# Patient Record
Sex: Male | Born: 1960 | Race: White | Hispanic: No | Marital: Married | Smoking: Never smoker
Health system: Southern US, Community
[De-identification: ages and names within clinical notes are randomized; demographics above are authoritative.]

---

## 2016-05-17 ENCOUNTER — Emergency Department (HOSPITAL_COMMUNITY): Payer: PRIVATE HEALTH INSURANCE

## 2016-05-17 ENCOUNTER — Emergency Department (HOSPITAL_COMMUNITY): Payer: PRIVATE HEALTH INSURANCE | Admitting: Certified Registered Nurse Anesthetist

## 2016-05-17 ENCOUNTER — Inpatient Hospital Stay (HOSPITAL_COMMUNITY): Payer: PRIVATE HEALTH INSURANCE

## 2016-05-17 ENCOUNTER — Inpatient Hospital Stay (HOSPITAL_COMMUNITY)
Admission: EM | Admit: 2016-05-17 | Discharge: 2016-05-29 | DRG: 957 | Disposition: A | Discharge status: Home / Self Care | Payer: PRIVATE HEALTH INSURANCE | Attending: General Surgery | Admitting: General Surgery

## 2016-05-17 ENCOUNTER — Encounter (HOSPITAL_COMMUNITY): Payer: Self-pay

## 2016-05-17 ENCOUNTER — Encounter (HOSPITAL_COMMUNITY): Admission: EM | Disposition: A | Discharge status: Home / Self Care | Payer: Self-pay | Source: Home / Self Care

## 2016-05-17 DIAGNOSIS — S27809A Unspecified injury of diaphragm, initial encounter: Secondary | ICD-10-CM | POA: Diagnosis present

## 2016-05-17 DIAGNOSIS — S1081XA Abrasion of other specified part of neck, initial encounter: Secondary | ICD-10-CM | POA: Diagnosis present

## 2016-05-17 DIAGNOSIS — R159 Full incontinence of feces: Secondary | ICD-10-CM | POA: Diagnosis present

## 2016-05-17 DIAGNOSIS — M48061 Spinal stenosis, lumbar region without neurogenic claudication: Secondary | ICD-10-CM | POA: Diagnosis present

## 2016-05-17 DIAGNOSIS — S92514A Nondisplaced fracture of proximal phalanx of right lesser toe(s), initial encounter for closed fracture: Secondary | ICD-10-CM | POA: Diagnosis present

## 2016-05-17 DIAGNOSIS — R0689 Other abnormalities of breathing: Secondary | ICD-10-CM

## 2016-05-17 DIAGNOSIS — S27809S Unspecified injury of diaphragm, sequela: Secondary | ICD-10-CM

## 2016-05-17 DIAGNOSIS — R579 Shock, unspecified: Secondary | ICD-10-CM | POA: Diagnosis present

## 2016-05-17 DIAGNOSIS — IMO0001 Reserved for inherently not codable concepts without codable children: Secondary | ICD-10-CM

## 2016-05-17 DIAGNOSIS — S322XXA Fracture of coccyx, initial encounter for closed fracture: Secondary | ICD-10-CM | POA: Diagnosis present

## 2016-05-17 DIAGNOSIS — R402362 Coma scale, best motor response, obeys commands, at arrival to emergency department: Secondary | ICD-10-CM | POA: Diagnosis present

## 2016-05-17 DIAGNOSIS — S272XXA Traumatic hemopneumothorax, initial encounter: Secondary | ICD-10-CM

## 2016-05-17 DIAGNOSIS — S50812A Abrasion of left forearm, initial encounter: Secondary | ICD-10-CM | POA: Diagnosis present

## 2016-05-17 DIAGNOSIS — R402242 Coma scale, best verbal response, confused conversation, at arrival to emergency department: Secondary | ICD-10-CM | POA: Diagnosis present

## 2016-05-17 DIAGNOSIS — S27808A Other injury of diaphragm, initial encounter: Principal | ICD-10-CM | POA: Diagnosis present

## 2016-05-17 DIAGNOSIS — R778 Other specified abnormalities of plasma proteins: Secondary | ICD-10-CM | POA: Diagnosis present

## 2016-05-17 DIAGNOSIS — M79674 Pain in right toe(s): Secondary | ICD-10-CM

## 2016-05-17 DIAGNOSIS — D62 Acute posthemorrhagic anemia: Secondary | ICD-10-CM | POA: Diagnosis not present

## 2016-05-17 DIAGNOSIS — S27321A Contusion of lung, unilateral, initial encounter: Secondary | ICD-10-CM | POA: Diagnosis present

## 2016-05-17 DIAGNOSIS — S32009A Unspecified fracture of unspecified lumbar vertebra, initial encounter for closed fracture: Secondary | ICD-10-CM | POA: Diagnosis present

## 2016-05-17 DIAGNOSIS — J9811 Atelectasis: Secondary | ICD-10-CM | POA: Diagnosis present

## 2016-05-17 DIAGNOSIS — Z9889 Other specified postprocedural states: Secondary | ICD-10-CM

## 2016-05-17 DIAGNOSIS — K449 Diaphragmatic hernia without obstruction or gangrene: Secondary | ICD-10-CM | POA: Diagnosis present

## 2016-05-17 DIAGNOSIS — S2691XA Contusion of heart, unspecified with or without hemopericardium, initial encounter: Secondary | ICD-10-CM | POA: Diagnosis present

## 2016-05-17 DIAGNOSIS — S30810A Abrasion of lower back and pelvis, initial encounter: Secondary | ICD-10-CM | POA: Diagnosis present

## 2016-05-17 DIAGNOSIS — S92911A Unspecified fracture of right toe(s), initial encounter for closed fracture: Secondary | ICD-10-CM | POA: Diagnosis present

## 2016-05-17 DIAGNOSIS — J9690 Respiratory failure, unspecified, unspecified whether with hypoxia or hypercapnia: Secondary | ICD-10-CM | POA: Diagnosis present

## 2016-05-17 DIAGNOSIS — S299XXA Unspecified injury of thorax, initial encounter: Secondary | ICD-10-CM

## 2016-05-17 DIAGNOSIS — R7989 Other specified abnormal findings of blood chemistry: Secondary | ICD-10-CM | POA: Diagnosis present

## 2016-05-17 DIAGNOSIS — R402142 Coma scale, eyes open, spontaneous, at arrival to emergency department: Secondary | ICD-10-CM | POA: Diagnosis present

## 2016-05-17 DIAGNOSIS — Y92410 Unspecified street and highway as the place of occurrence of the external cause: Secondary | ICD-10-CM

## 2016-05-17 DIAGNOSIS — E875 Hyperkalemia: Secondary | ICD-10-CM | POA: Diagnosis not present

## 2016-05-17 DIAGNOSIS — S32041A Stable burst fracture of fourth lumbar vertebra, initial encounter for closed fracture: Secondary | ICD-10-CM | POA: Diagnosis present

## 2016-05-17 DIAGNOSIS — Z938 Other artificial opening status: Secondary | ICD-10-CM

## 2016-05-17 DIAGNOSIS — S92501A Displaced unspecified fracture of right lesser toe(s), initial encounter for closed fracture: Secondary | ICD-10-CM | POA: Diagnosis present

## 2016-05-17 DIAGNOSIS — S32001A Stable burst fracture of unspecified lumbar vertebra, initial encounter for closed fracture: Secondary | ICD-10-CM

## 2016-05-17 DIAGNOSIS — S0001XA Abrasion of scalp, initial encounter: Secondary | ICD-10-CM | POA: Diagnosis present

## 2016-05-17 DIAGNOSIS — R748 Abnormal levels of other serum enzymes: Secondary | ICD-10-CM | POA: Diagnosis not present

## 2016-05-17 DIAGNOSIS — J9600 Acute respiratory failure, unspecified whether with hypoxia or hypercapnia: Secondary | ICD-10-CM | POA: Diagnosis present

## 2016-05-17 DIAGNOSIS — S32049A Unspecified fracture of fourth lumbar vertebra, initial encounter for closed fracture: Secondary | ICD-10-CM | POA: Diagnosis present

## 2016-05-17 DIAGNOSIS — S0081XA Abrasion of other part of head, initial encounter: Secondary | ICD-10-CM | POA: Diagnosis present

## 2016-05-17 DIAGNOSIS — R609 Edema, unspecified: Secondary | ICD-10-CM

## 2016-05-17 DIAGNOSIS — S93114A Dislocation of interphalangeal joint of right lesser toe(s), initial encounter: Secondary | ICD-10-CM | POA: Diagnosis present

## 2016-05-17 HISTORY — PX: LAPAROTOMY: SHX154

## 2016-05-17 LAB — PREPARE FRESH FROZEN PLASMA
UNIT DIVISION: 0
Unit division: 0

## 2016-05-17 LAB — COMPREHENSIVE METABOLIC PANEL
ALBUMIN: 3 g/dL — AB (ref 3.5–5.0)
ALT: 46 U/L (ref 17–63)
AST: 63 U/L — AB (ref 15–41)
Alkaline Phosphatase: 87 U/L (ref 38–126)
Anion gap: 10 (ref 5–15)
BUN: 14 mg/dL (ref 6–20)
CO2: 21 mmol/L — ABNORMAL LOW (ref 22–32)
CREATININE: 1.39 mg/dL — AB (ref 0.61–1.24)
Calcium: 8.1 mg/dL — ABNORMAL LOW (ref 8.9–10.3)
Chloride: 110 mmol/L (ref 101–111)
GFR calc Af Amer: >60 mL/min (ref >60)
GFR, EST NON AFRICAN AMERICAN: 55 mL/min — AB (ref >60)
GLUCOSE: 177 mg/dL — AB (ref 65–99)
POTASSIUM: 2.9 mmol/L — AB (ref 3.5–5.1)
Sodium: 141 mmol/L (ref 135–145)
Total Bilirubin: 0.4 mg/dL (ref 0.3–1.2)
Total Protein: 5.5 g/dL — ABNORMAL LOW (ref 6.5–8.1)

## 2016-05-17 LAB — POCT I-STAT 7, (LYTES, BLD GAS, ICA,H+H)
Acid-base deficit: 6 mmol/L — ABNORMAL HIGH (ref 0.0–2.0)
Bicarbonate: 22.3 mmol/L (ref 20.0–28.0)
CALCIUM ION: 1.19 mmol/L (ref 1.15–1.40)
HEMATOCRIT: 26 % — AB (ref 39.0–52.0)
Hemoglobin: 8.8 g/dL — ABNORMAL LOW (ref 13.0–17.0)
O2 Saturation: 97 %
POTASSIUM: 3.9 mmol/L (ref 3.5–5.1)
SODIUM: 143 mmol/L (ref 135–145)
TCO2: 24 mmol/L (ref 0–100)
pCO2 arterial: 48.1 mmHg — ABNORMAL HIGH (ref 32.0–48.0)
pH, Arterial: 7.254 — ABNORMAL LOW (ref 7.350–7.450)
pO2, Arterial: 92 mmHg (ref 83.0–108.0)

## 2016-05-17 LAB — CBC
HEMATOCRIT: 36.9 % — AB (ref 39.0–52.0)
HEMATOCRIT: 39.9 % (ref 39.0–52.0)
Hemoglobin: 12.5 g/dL — ABNORMAL LOW (ref 13.0–17.0)
Hemoglobin: 13.4 g/dL (ref 13.0–17.0)
MCH: 31.3 pg (ref 26.0–34.0)
MCH: 30 pg (ref 26.0–34.0)
MCHC: 33.9 g/dL (ref 30.0–36.0)
MCHC: 33.6 g/dL (ref 30.0–36.0)
MCV: 92.5 fL (ref 78.0–100.0)
MCV: 89.5 fL (ref 78.0–100.0)
PLATELETS: 372 10*3/uL (ref 150–400)
Platelets: 200 10*3/uL (ref 150–400)
RBC: 3.99 MIL/uL — ABNORMAL LOW (ref 4.22–5.81)
RBC: 4.46 MIL/uL (ref 4.22–5.81)
RDW: 12.4 % (ref 11.5–15.5)
RDW: 13.7 % (ref 11.5–15.5)
WBC: 35.1 10*3/uL — ABNORMAL HIGH (ref 4.0–10.5)
WBC: 20.8 10*3/uL — ABNORMAL HIGH (ref 4.0–10.5)

## 2016-05-17 LAB — BASIC METABOLIC PANEL
Anion gap: 7 (ref 5–15)
BUN: 15 mg/dL (ref 6–20)
CHLORIDE: 114 mmol/L — AB (ref 101–111)
CO2: 22 mmol/L (ref 22–32)
CREATININE: 1.25 mg/dL — AB (ref 0.61–1.24)
Calcium: 7.4 mg/dL — ABNORMAL LOW (ref 8.9–10.3)
GFR calc non Af Amer: >60 mL/min (ref >60)
Glucose, Bld: 164 mg/dL — ABNORMAL HIGH (ref 65–99)
Potassium: 3.6 mmol/L (ref 3.5–5.1)
Sodium: 143 mmol/L (ref 135–145)

## 2016-05-17 LAB — MAGNESIUM: Magnesium: 1.7 mg/dL (ref 1.7–2.4)

## 2016-05-17 LAB — I-STAT CHEM 8, ED
BUN: 16 mg/dL (ref 6–20)
CREATININE: 1.3 mg/dL — AB (ref 0.61–1.24)
Calcium, Ion: 1.09 mmol/L — ABNORMAL LOW (ref 1.15–1.40)
Chloride: 107 mmol/L (ref 101–111)
Glucose, Bld: 177 mg/dL — ABNORMAL HIGH (ref 65–99)
HEMATOCRIT: 35 % — AB (ref 39.0–52.0)
HEMOGLOBIN: 11.9 g/dL — AB (ref 13.0–17.0)
POTASSIUM: 2.8 mmol/L — AB (ref 3.5–5.1)
SODIUM: 142 mmol/L (ref 135–145)
TCO2: 20 mmol/L (ref 0–100)

## 2016-05-17 LAB — BLOOD GAS, ARTERIAL
Acid-base deficit: 6.8 mmol/L — ABNORMAL HIGH (ref 0.0–2.0)
Bicarbonate: 19.3 mmol/L — ABNORMAL LOW (ref 20.0–28.0)
Drawn by: 345601
FIO2: 60
O2 Saturation: 97.7 %
PEEP: 5 cmH2O
Patient temperature: 97.4
RATE: 22 resp/min
VT: 570 mL
pCO2 arterial: 45.5 mmHg (ref 32.0–48.0)
pH, Arterial: 7.246 — ABNORMAL LOW (ref 7.350–7.450)
pO2, Arterial: 116 mmHg — ABNORMAL HIGH (ref 83.0–108.0)

## 2016-05-17 LAB — PROTIME-INR
INR: 1.05
INR: 1.32
Prothrombin Time: 13.7 seconds (ref 11.4–15.2)
Prothrombin Time: 16.5 seconds — ABNORMAL HIGH (ref 11.4–15.2)

## 2016-05-17 LAB — PHOSPHORUS: PHOSPHORUS: 4.3 mg/dL (ref 2.5–4.6)

## 2016-05-17 LAB — ETHANOL: Alcohol, Ethyl (B): <5 mg/dL (ref <5)

## 2016-05-17 LAB — I-STAT CG4 LACTIC ACID, ED: Lactic Acid, Venous: 5.37 mmol/L (ref 0.5–1.9)

## 2016-05-17 LAB — ABO/RH: ABO/RH(D): O NEG

## 2016-05-17 LAB — APTT: APTT: 26 s (ref 24–36)

## 2016-05-17 SURGERY — LAPAROTOMY, EXPLORATORY
Anesthesia: General | Site: Abdomen

## 2016-05-17 MED ORDER — ENOXAPARIN SODIUM 40 MG/0.4ML ~~LOC~~ SOLN
40.0000 mg | SUBCUTANEOUS | Status: DC
  Administered 2016-05-18 – 2016-05-22 (×5): 40 mg via SUBCUTANEOUS
  Filled 2016-05-17 (×5): qty 0.4

## 2016-05-17 MED ORDER — POTASSIUM CHLORIDE IN NACL 20-0.9 MEQ/L-% IV SOLN
INTRAVENOUS | Status: DC
  Administered 2016-05-17 – 2016-05-19 (×4): via INTRAVENOUS
  Administered 2016-05-19: 100 mL/h via INTRAVENOUS
  Administered 2016-05-20 – 2016-05-23 (×9): via INTRAVENOUS
  Filled 2016-05-17 (×21): qty 1000

## 2016-05-17 MED ORDER — SODIUM CHLORIDE 0.9 % IV SOLN
30.0000 meq | INTRAVENOUS | Status: AC
  Administered 2016-05-17: 30 meq via INTRAVENOUS
  Filled 2016-05-17: qty 15

## 2016-05-17 MED ORDER — FENTANYL CITRATE (PF) 100 MCG/2ML IJ SOLN
INTRAMUSCULAR | Status: AC | PRN
  Administered 2016-05-17 (×2): 50 ug via INTRAVENOUS

## 2016-05-17 MED ORDER — FENTANYL CITRATE (PF) 100 MCG/2ML IJ SOLN
INTRAMUSCULAR | Status: AC
  Filled 2016-05-17: qty 2

## 2016-05-17 MED ORDER — SODIUM BICARBONATE 4.2 % IV SOLN
INTRAVENOUS | Status: DC | PRN
  Administered 2016-05-17 (×4): 50 meq via INTRAVENOUS

## 2016-05-17 MED ORDER — CEFAZOLIN SODIUM-DEXTROSE 2-4 GM/100ML-% IV SOLN
INTRAVENOUS | Status: AC
  Filled 2016-05-17: qty 100

## 2016-05-17 MED ORDER — 0.9 % SODIUM CHLORIDE (POUR BTL) OPTIME
TOPICAL | Status: DC | PRN
  Administered 2016-05-17 (×3): 1000 mL

## 2016-05-17 MED ORDER — PROPOFOL 10 MG/ML IV BOLUS
INTRAVENOUS | Status: AC
  Filled 2016-05-17: qty 20

## 2016-05-17 MED ORDER — MIDAZOLAM HCL 5 MG/5ML IJ SOLN
INTRAMUSCULAR | Status: DC | PRN
  Administered 2016-05-17 (×2): 2 mg via INTRAVENOUS

## 2016-05-17 MED ORDER — ROCURONIUM BROMIDE 100 MG/10ML IV SOLN
INTRAVENOUS | Status: DC | PRN
  Administered 2016-05-17: 50 mg via INTRAVENOUS

## 2016-05-17 MED ORDER — FENTANYL BOLUS VIA INFUSION
50.0000 ug | INTRAVENOUS | Status: DC | PRN
  Administered 2016-05-19 – 2016-05-21 (×4): 50 ug via INTRAVENOUS
  Filled 2016-05-17 (×2): qty 50

## 2016-05-17 MED ORDER — MIDAZOLAM HCL 2 MG/2ML IJ SOLN
INTRAMUSCULAR | Status: AC
  Filled 2016-05-17: qty 2

## 2016-05-17 MED ORDER — ONDANSETRON HCL 4 MG/2ML IJ SOLN
INTRAMUSCULAR | Status: DC | PRN
  Administered 2016-05-17: 4 mg via INTRAVENOUS

## 2016-05-17 MED ORDER — CHLORHEXIDINE GLUCONATE 0.12% ORAL RINSE (MEDLINE KIT)
15.0000 mL | Freq: Two times a day (BID) | OROMUCOSAL | Status: DC
  Administered 2016-05-17 – 2016-05-20 (×6): 15 mL via OROMUCOSAL

## 2016-05-17 MED ORDER — DIPHENHYDRAMINE HCL 50 MG/ML IJ SOLN: 12.5000 mg | Freq: Four times a day (QID) | INTRAMUSCULAR | Status: DC | PRN

## 2016-05-17 MED ORDER — LACTATED RINGERS IV SOLN
INTRAVENOUS | Status: DC | PRN
  Administered 2016-05-17 (×3): via INTRAVENOUS

## 2016-05-17 MED ORDER — PROPOFOL 10 MG/ML IV BOLUS
INTRAVENOUS | Status: DC | PRN
  Administered 2016-05-17: 100 mg via INTRAVENOUS

## 2016-05-17 MED ORDER — PROPOFOL 1000 MG/100ML IV EMUL
0.0000 ug/kg/min | INTRAVENOUS | Status: DC
  Administered 2016-05-17: 16 ug/kg/min via INTRAVENOUS
  Administered 2016-05-18: 20 ug/kg/min via INTRAVENOUS
  Administered 2016-05-18: 18 ug/kg/min via INTRAVENOUS
  Administered 2016-05-18: 20 ug/kg/min via INTRAVENOUS
  Administered 2016-05-19: 15 ug/kg/min via INTRAVENOUS
  Administered 2016-05-19 (×2): 20 ug/kg/min via INTRAVENOUS
  Administered 2016-05-20: 10 ug/kg/min via INTRAVENOUS
  Administered 2016-05-20: 15 ug/kg/min via INTRAVENOUS
  Administered 2016-05-20: 20 ug/kg/min via INTRAVENOUS
  Administered 2016-05-21 (×2): 15 ug/kg/min via INTRAVENOUS
  Filled 2016-05-17 (×12): qty 100

## 2016-05-17 MED ORDER — FENTANYL CITRATE (PF) 100 MCG/2ML IJ SOLN
INTRAMUSCULAR | Status: AC
  Filled 2016-05-17: qty 4

## 2016-05-17 MED ORDER — ONDANSETRON HCL 4 MG/2ML IJ SOLN: 4.0000 mg | Freq: Four times a day (QID) | INTRAMUSCULAR | Status: DC | PRN

## 2016-05-17 MED ORDER — PROPOFOL 500 MG/50ML IV EMUL
INTRAVENOUS | Status: AC
  Filled 2016-05-17: qty 50

## 2016-05-17 MED ORDER — FENTANYL CITRATE (PF) 100 MCG/2ML IJ SOLN: 50.0000 ug | Freq: Once | INTRAMUSCULAR | Status: DC

## 2016-05-17 MED ORDER — IOPAMIDOL (ISOVUE-300) INJECTION 61%
INTRAVENOUS | Status: AC
  Administered 2016-05-17: 100 mL
  Filled 2016-05-17: qty 100

## 2016-05-17 MED ORDER — FENTANYL CITRATE (PF) 100 MCG/2ML IJ SOLN: INTRAMUSCULAR | Status: DC | PRN

## 2016-05-17 MED ORDER — SODIUM BICARBONATE 8.4 % IV SOLN
INTRAVENOUS | Status: AC
  Filled 2016-05-17: qty 50

## 2016-05-17 MED ORDER — DEXTROSE 5 % IV SOLN
INTRAVENOUS | Status: AC | PRN
  Administered 2016-05-17: 2000 mg via INTRAVENOUS

## 2016-05-17 MED ORDER — PANTOPRAZOLE SODIUM 40 MG IV SOLR
40.0000 mg | Freq: Every day | INTRAVENOUS | Status: DC
  Administered 2016-05-17 – 2016-05-22 (×6): 40 mg via INTRAVENOUS
  Filled 2016-05-17 (×6): qty 40

## 2016-05-17 MED ORDER — PHENYLEPHRINE HCL 10 MG/ML IJ SOLN
INTRAVENOUS | Status: DC | PRN
  Administered 2016-05-17: 100 ug/min via INTRAVENOUS

## 2016-05-17 MED ORDER — SODIUM CHLORIDE 0.9 % IV SOLN
INTRAVENOUS | Status: AC | PRN
  Administered 2016-05-17 (×2): 1000 mL via INTRAVENOUS

## 2016-05-17 MED ORDER — SODIUM CHLORIDE 0.9 % IV SOLN
25.0000 ug/h | INTRAVENOUS | Status: DC
  Administered 2016-05-17: 50 ug/h via INTRAVENOUS
  Administered 2016-05-18 – 2016-05-19 (×2): 100 ug/h via INTRAVENOUS
  Administered 2016-05-20 – 2016-05-22 (×3): 125 ug/h via INTRAVENOUS
  Filled 2016-05-17 (×7): qty 50

## 2016-05-17 MED ORDER — SUCCINYLCHOLINE CHLORIDE 20 MG/ML IJ SOLN
INTRAMUSCULAR | Status: DC | PRN
  Administered 2016-05-17: 140 mg via INTRAVENOUS

## 2016-05-17 MED ORDER — SUGAMMADEX SODIUM 200 MG/2ML IV SOLN
INTRAVENOUS | Status: AC
  Filled 2016-05-17: qty 2

## 2016-05-17 MED ORDER — ONDANSETRON HCL 4 MG/2ML IJ SOLN
INTRAMUSCULAR | Status: AC
  Filled 2016-05-17: qty 2

## 2016-05-17 MED ORDER — PANTOPRAZOLE SODIUM 40 MG PO TBEC
40.0000 mg | DELAYED_RELEASE_TABLET | Freq: Every day | ORAL | Status: DC
  Administered 2016-05-23 – 2016-05-26 (×4): 40 mg via ORAL
  Filled 2016-05-17 (×4): qty 1

## 2016-05-17 MED ORDER — CALCIUM CHLORIDE 10 % IV SOLN
INTRAVENOUS | Status: DC | PRN
  Administered 2016-05-17: 1 g via INTRAVENOUS

## 2016-05-17 MED ORDER — LIDOCAINE 2% (20 MG/ML) 5 ML SYRINGE
INTRAMUSCULAR | Status: AC
  Filled 2016-05-17: qty 5

## 2016-05-17 MED ORDER — PHENYLEPHRINE HCL 10 MG/ML IJ SOLN
0.0000 ug/min | INTRAVENOUS | Status: DC
  Administered 2016-05-17: 20 ug/min via INTRAVENOUS
  Administered 2016-05-18: 55 ug/min via INTRAVENOUS
  Administered 2016-05-18 (×2): 40 ug/min via INTRAVENOUS
  Administered 2016-05-18: 25 ug/min via INTRAVENOUS
  Administered 2016-05-19: 20 ug/min via INTRAVENOUS
  Administered 2016-05-19: 30 ug/min via INTRAVENOUS
  Administered 2016-05-19: 15 ug/min via INTRAVENOUS
  Administered 2016-05-20: 10 ug/min via INTRAVENOUS
  Administered 2016-05-20: 15 ug/min via INTRAVENOUS
  Filled 2016-05-17 (×10): qty 1

## 2016-05-17 MED ORDER — LIDOCAINE HCL (CARDIAC) 20 MG/ML IV SOLN
INTRAVENOUS | Status: DC | PRN
  Administered 2016-05-17: 100 mg via INTRAVENOUS

## 2016-05-17 MED ORDER — CALCIUM CHLORIDE 10 % IV SOLN
INTRAVENOUS | Status: AC
Start: 2016-05-17 — End: 2016-05-17
  Filled 2016-05-17: qty 10

## 2016-05-17 MED ORDER — ORAL CARE MOUTH RINSE
15.0000 mL | Freq: Four times a day (QID) | OROMUCOSAL | Status: DC
  Administered 2016-05-18 – 2016-05-20 (×12): 15 mL via OROMUCOSAL

## 2016-05-17 MED ORDER — SUGAMMADEX SODIUM 200 MG/2ML IV SOLN
INTRAVENOUS | Status: DC | PRN
  Administered 2016-05-17: 200 mg via INTRAVENOUS

## 2016-05-17 MED ORDER — FENTANYL CITRATE (PF) 100 MCG/2ML IJ SOLN
INTRAMUSCULAR | Status: DC | PRN
  Administered 2016-05-17 (×3): 50 ug via INTRAVENOUS
  Administered 2016-05-17: 100 ug via INTRAVENOUS
  Administered 2016-05-17: 50 ug via INTRAVENOUS

## 2016-05-17 MED ORDER — DIPHENHYDRAMINE HCL 12.5 MG/5ML PO ELIX: 12.5000 mg | ORAL_SOLUTION | Freq: Four times a day (QID) | ORAL | Status: DC | PRN

## 2016-05-17 MED ORDER — SODIUM CHLORIDE 0.9 % IV SOLN
INTRAVENOUS | Status: DC | PRN
  Administered 2016-05-17: 20:00:00 via INTRAVENOUS

## 2016-05-17 SURGICAL SUPPLY — 45 items
BLADE SURG ROTATE 9660 (MISCELLANEOUS) ×3 IMPLANT
CANISTER SUCTION 2500CC (MISCELLANEOUS) ×3 IMPLANT
CATH THORACIC 40FR (CATHETERS) ×3 IMPLANT
CHLORAPREP W/TINT 26ML (MISCELLANEOUS) ×3 IMPLANT
COVER SURGICAL LIGHT HANDLE (MISCELLANEOUS) ×3 IMPLANT
DRAPE LAPAROSCOPIC ABDOMINAL (DRAPES) ×3 IMPLANT
DRAPE WARM FLUID 44X44 (DRAPE) ×3 IMPLANT
DRSG OPSITE POSTOP 4X10 (GAUZE/BANDAGES/DRESSINGS) ×3 IMPLANT
DRSG OPSITE POSTOP 4X8 (GAUZE/BANDAGES/DRESSINGS) IMPLANT
ELECT BLADE 6.5 EXT (BLADE) ×3 IMPLANT
ELECT CAUTERY BLADE 6.4 (BLADE) ×3 IMPLANT
ELECT REM PT RETURN 9FT ADLT (ELECTROSURGICAL) ×3
ELECTRODE REM PT RTRN 9FT ADLT (ELECTROSURGICAL) ×1 IMPLANT
GAUZE SPONGE 4X4 12PLY STRL (GAUZE/BANDAGES/DRESSINGS) ×3 IMPLANT
GLOVE BIOGEL M STRL SZ7.5 (GLOVE) ×3 IMPLANT
GLOVE BIOGEL PI IND STRL 8 (GLOVE) ×1 IMPLANT
GLOVE BIOGEL PI INDICATOR 8 (GLOVE) ×2
GOWN STRL REUS W/ TWL LRG LVL3 (GOWN DISPOSABLE) ×1 IMPLANT
GOWN STRL REUS W/TWL 2XL LVL3 (GOWN DISPOSABLE) ×3 IMPLANT
GOWN STRL REUS W/TWL LRG LVL3 (GOWN DISPOSABLE) ×2
KIT BASIN OR (CUSTOM PROCEDURE TRAY) ×3 IMPLANT
KIT ROOM TURNOVER OR (KITS) ×3 IMPLANT
LIGASURE IMPACT 36 18CM CVD LR (INSTRUMENTS) IMPLANT
NS IRRIG 1000ML POUR BTL (IV SOLUTION) ×6 IMPLANT
PACK GENERAL/GYN (CUSTOM PROCEDURE TRAY) ×3 IMPLANT
PAD ARMBOARD 7.5X6 YLW CONV (MISCELLANEOUS) ×3 IMPLANT
SEALANT PATCH FIBRIN 2X4IN (MISCELLANEOUS) ×3 IMPLANT
SPECIMEN JAR LARGE (MISCELLANEOUS) IMPLANT
SPONGE LAP 18X18 X RAY DECT (DISPOSABLE) IMPLANT
STAPLER VISISTAT 35W (STAPLE) ×3 IMPLANT
SUCTION POOLE TIP (SUCTIONS) ×3 IMPLANT
SUT PDS AB 1 TP1 96 (SUTURE) ×6 IMPLANT
SUT PROLENE 0 CT (SUTURE) ×15 IMPLANT
SUT SILK 0 FSL (SUTURE) ×3 IMPLANT
SUT SILK 2 0 SH (SUTURE) ×3 IMPLANT
SUT SILK 2 0 SH CR/8 (SUTURE) ×3 IMPLANT
SUT SILK 2 0 TIES 10X30 (SUTURE) ×3 IMPLANT
SUT SILK 3 0 SH CR/8 (SUTURE) ×3 IMPLANT
SUT SILK 3 0 TIES 10X30 (SUTURE) ×3 IMPLANT
SUT VIC AB 3-0 SH 18 (SUTURE) ×3 IMPLANT
SYSTEM SAHARA CHEST DRAIN ATS (WOUND CARE) ×3 IMPLANT
TAPE CLOTH SURG 4X10 WHT LF (GAUZE/BANDAGES/DRESSINGS) ×3 IMPLANT
TOWEL OR 17X26 10 PK STRL BLUE (TOWEL DISPOSABLE) ×3 IMPLANT
TRAY FOLEY CATH 16FRSI W/METER (SET/KITS/TRAYS/PACK) ×3 IMPLANT
YANKAUER SUCT BULB TIP NO VENT (SUCTIONS) IMPLANT

## 2016-05-17 NOTE — ED Notes (Signed)
CG-4 and Chem-8 results reported to Dr. Anitra LauthPlunkett
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
## 2016-05-17 NOTE — ED Notes (Signed)
Dr Andrey Campanilewilson at bedside updating family and patient

## 2016-05-17 NOTE — Anesthesia Procedure Notes (Addendum)
Date/Time: 05/17/2016 7:31 PM Performed by: Molli HazardGORDON, Gizzelle Lacomb M Pre-anesthesia Checklist: Patient identified, Emergency Drugs available, Suction available and Patient being monitored Patient Re-evaluated:Patient Re-evaluated prior to inductionOxygen Delivery Method: Circle system utilized Preoxygenation: Pre-oxygenation with 100% oxygen Intubation Type: IV induction, Cricoid Pressure applied and Rapid sequence Laryngoscope Size: Glidescope Grade View: Grade I Tube type: Subglottic suction tube Tube size: 7.5 mm Number of attempts: 1 Airway Equipment and Method: Stylet Placement Confirmation: ETT inserted through vocal cords under direct vision,  positive ETCO2 and breath sounds checked- equal and bilateral Secured at: 23 cm Tube secured with: Tape Dental Injury: Teeth and Oropharynx as per pre-operative assessment

## 2016-05-17 NOTE — Brief Op Note (Signed)
05/17/2016  9:20 PM  PATIENT:  Rickey [name]  55 y.o. 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   POST-OPERATIVE DIAGNOSIS: same   PROCEDURE:  Procedure(s): EXPLORATORY LAPAROTOMY - REPAIR OF DIAPHRAGM, left chest tube placement (N/A)  SURGEON:  Surgeon(s) and Role:    * Abigail Miyamotoouglas Blackman, MD - Assisting    * Gaynelle AduEric Alic Hilburn, MD - Primary  PHYSICIAN ASSISTANT:   ASSISTANTS: see above   ANESTHESIA:   general  EBL:  Total I/O In: 4340 [I.V.:3000; Blood:1340] Out: 400 [Urine:200; Blood:200]  BLOOD ADMINISTERED:4 units CC PRBC  DRAINS: Nasogastric Tube, Urinary Catheter (Foley) and 40Fr Chest Tube(s) in the L pleural cavity   LOCAL MEDICATIONS USED:  NONE  SPECIMEN:  No Specimen  DISPOSITION OF SPECIMEN:  N/A  COUNTS:  YES  TOURNIQUET:  * No tourniquets in log *  DICTATION: .Other Dictation: Dictation Number [id number]  PLAN OF CARE: Admit to inpatient   PATIENT DISPOSITION:  PACU - guarded condition.   Delay start of Pharmacological VTE agent (>24hrs) due to surgical blood loss or risk of bleeding: may start in 12 hrs - watch for bleeding
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
## 2016-05-17 NOTE — ED Notes (Signed)
Family at beside. Family given emotional support. 
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
## 2016-05-17 NOTE — Transfer of Care (Signed)
Immediate Anesthesia Transfer of Care Note  Patient: Rickey [name]  Procedure(s) Performed: Procedure(s): EXPLORATORY LAPAROTOMY - REPAIR OF DIAPHRAGM, left chest tube placement (N/A)  Patient Location: NICU  Anesthesia Type:General  Level of Consciousness: Patient remains intubated per anesthesia plan  Airway & Oxygen Therapy: Patient placed on Ventilator (see vital sign flow sheet for setting)  Post-op Assessment: Report given to RN, Post -op Vital signs reviewed and stable and Patient moving all extremities X 4  Post vital signs: Reviewed and stable  Last Vitals:  Vitals:   05/17/16 1845 05/17/16 1848  BP:  97/59  Pulse: (!) 121 119  Resp: (!) 43 (!) 30  Temp:      Last Pain:  Vitals:   05/17/16 1848  TempSrc:   PainSc: 8          Complications: No apparent anesthesia complications

## 2016-05-17 NOTE — Anesthesia Preprocedure Evaluation (Signed)
Anesthesia Evaluation  Patient identified by MRN, date of birth, ID band Patient awake    Reviewed: Allergy & Precautions, NPO status , Patient's Chart, lab work & pertinent test results  Airway Mallampati: I  TM Distance: >3 FB Neck ROM: Full    Dental   Pulmonary    Pulmonary exam normal        Cardiovascular Normal cardiovascular exam     Neuro/Psych    GI/Hepatic   Endo/Other    Renal/GU      Musculoskeletal   Abdominal   Peds  Hematology   Anesthesia Other Findings   Reproductive/Obstetrics                             Anesthesia Physical Anesthesia Plan  ASA: III  Anesthesia Plan: General   Post-op Pain Management:    Induction: Intravenous, Rapid sequence and Cricoid pressure planned  Airway Management Planned: Oral ETT and Video Laryngoscope Planned  Additional Equipment:   Intra-op Plan:   Post-operative Plan: Possible Post-op intubation/ventilation  Informed Consent: I have reviewed the patients History and Physical, chart, labs and discussed the procedure including the risks, benefits and alternatives for the proposed anesthesia with the patient or authorized representative who has indicated his/her understanding and acceptance.     Plan Discussed with: CRNA and Surgeon  Anesthesia Plan Comments:         Anesthesia Quick Evaluation
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
## 2016-05-17 NOTE — Anesthesia Postprocedure Evaluation (Signed)
Anesthesia Post Note  Patient: Rickey [name]  Procedure(s) Performed: Procedure(s) (LRB): EXPLORATORY LAPAROTOMY - REPAIR OF DIAPHRAGM, left chest tube placement (N/A)  Patient location during evaluation: SICU Anesthesia Type: General Level of consciousness: sedated Pain management: pain level controlled Vital Signs Assessment: post-procedure vital signs reviewed and stable Respiratory status: patient remains intubated per anesthesia plan Cardiovascular status: stable Anesthetic complications: no    Last Vitals:  Vitals:   05/17/16 1848 05/17/16 2200  BP: 97/59 (!) 76/67  Pulse: 119 (!) 106  Resp: (!) 30 (!) 26  Temp:      Last Pain:  Vitals:   05/17/16 1848  TempSrc:   PainSc: 8                  Eyva Califano DAVID

## 2016-05-18 ENCOUNTER — Inpatient Hospital Stay (HOSPITAL_COMMUNITY): Payer: PRIVATE HEALTH INSURANCE

## 2016-05-18 ENCOUNTER — Encounter (HOSPITAL_COMMUNITY): Payer: Self-pay | Admitting: General Surgery

## 2016-05-18 DIAGNOSIS — S2691XA Contusion of heart, unspecified with or without hemopericardium, initial encounter: Secondary | ICD-10-CM

## 2016-05-18 DIAGNOSIS — R778 Other specified abnormalities of plasma proteins: Secondary | ICD-10-CM | POA: Diagnosis present

## 2016-05-18 DIAGNOSIS — R748 Abnormal levels of other serum enzymes: Secondary | ICD-10-CM

## 2016-05-18 DIAGNOSIS — S27809A Unspecified injury of diaphragm, initial encounter: Secondary | ICD-10-CM | POA: Diagnosis present

## 2016-05-18 DIAGNOSIS — R7989 Other specified abnormal findings of blood chemistry: Secondary | ICD-10-CM | POA: Diagnosis present

## 2016-05-18 LAB — CBC
HCT: 40.1 % (ref 39.0–52.0)
HCT: 35.4 % — ABNORMAL LOW (ref 39.0–52.0)
HEMOGLOBIN: 13.9 g/dL (ref 13.0–17.0)
Hemoglobin: 12 g/dL — ABNORMAL LOW (ref 13.0–17.0)
MCH: 30.7 pg (ref 26.0–34.0)
MCH: 29.9 pg (ref 26.0–34.0)
MCHC: 34.7 g/dL (ref 30.0–36.0)
MCHC: 33.9 g/dL (ref 30.0–36.0)
MCV: 88.5 fL (ref 78.0–100.0)
MCV: 88.1 fL (ref 78.0–100.0)
PLATELETS: 171 10*3/uL (ref 150–400)
Platelets: 182 10*3/uL (ref 150–400)
RBC: 4.53 MIL/uL (ref 4.22–5.81)
RBC: 4.02 MIL/uL — AB (ref 4.22–5.81)
RDW: 14.7 % (ref 11.5–15.5)
RDW: 15 % (ref 11.5–15.5)
WBC: 15.8 10*3/uL — ABNORMAL HIGH (ref 4.0–10.5)
WBC: 12.5 10*3/uL — ABNORMAL HIGH (ref 4.0–10.5)

## 2016-05-18 LAB — URINALYSIS, ROUTINE W REFLEX MICROSCOPIC
Glucose, UA: NEGATIVE mg/dL
KETONES UR: NEGATIVE mg/dL
Leukocytes, UA: NEGATIVE
NITRITE: NEGATIVE
Protein, ur: 30 mg/dL — AB
SPECIFIC GRAVITY, URINE: 1.02 (ref 1.005–1.030)
pH: 5.5 (ref 5.0–8.0)

## 2016-05-18 LAB — COMPREHENSIVE METABOLIC PANEL
ALBUMIN: 2.3 g/dL — AB (ref 3.5–5.0)
ALK PHOS: 67 U/L (ref 38–126)
ALT: 144 U/L — AB (ref 17–63)
AST: 264 U/L — ABNORMAL HIGH (ref 15–41)
Anion gap: 10 (ref 5–15)
BUN: 16 mg/dL (ref 6–20)
CALCIUM: 7.8 mg/dL — AB (ref 8.9–10.3)
CO2: 21 mmol/L — AB (ref 22–32)
CREATININE: 1.37 mg/dL — AB (ref 0.61–1.24)
Chloride: 112 mmol/L — ABNORMAL HIGH (ref 101–111)
GFR calc Af Amer: >60 mL/min (ref >60)
GFR calc non Af Amer: 56 mL/min — ABNORMAL LOW (ref >60)
GLUCOSE: 120 mg/dL — AB (ref 65–99)
Potassium: 5 mmol/L (ref 3.5–5.1)
SODIUM: 143 mmol/L (ref 135–145)
Total Bilirubin: 1.3 mg/dL — ABNORMAL HIGH (ref 0.3–1.2)
Total Protein: 4.3 g/dL — ABNORMAL LOW (ref 6.5–8.1)

## 2016-05-18 LAB — MRSA PCR SCREENING: MRSA BY PCR: NEGATIVE

## 2016-05-18 LAB — URINALYSIS, MICROSCOPIC (REFLEX)

## 2016-05-18 LAB — APTT: aPTT: 30 seconds (ref 24–36)

## 2016-05-18 LAB — PROTIME-INR
INR: 1.21
Prothrombin Time: 15.4 seconds — ABNORMAL HIGH (ref 11.4–15.2)

## 2016-05-18 LAB — TROPONIN I
Troponin I: 0.12 ng/mL (ref <0.03)
Troponin I: 0.13 ng/mL (ref <0.03)

## 2016-05-18 LAB — BLOOD PRODUCT ORDER (VERBAL) VERIFICATION

## 2016-05-18 LAB — TRIGLYCERIDES: Triglycerides: 209 mg/dL — ABNORMAL HIGH (ref <150)

## 2016-05-18 MED ORDER — IPRATROPIUM-ALBUTEROL 0.5-2.5 (3) MG/3ML IN SOLN
3.0000 mL | Freq: Four times a day (QID) | RESPIRATORY_TRACT | Status: DC | PRN
  Administered 2016-05-23: 3 mL via RESPIRATORY_TRACT
  Filled 2016-05-18 (×2): qty 3

## 2016-05-18 MED ORDER — CALCIUM GLUCONATE 10 % IV SOLN
1.0000 g | Freq: Once | INTRAVENOUS | Status: AC
  Administered 2016-05-18: 1 g via INTRAVENOUS
  Filled 2016-05-18: qty 10

## 2016-05-18 MED ORDER — SODIUM CHLORIDE 0.9% FLUSH
10.0000 mL | INTRAVENOUS | Status: DC | PRN
  Administered 2016-05-19: 10 mL
  Filled 2016-05-18: qty 40

## 2016-05-18 MED ORDER — ALBUMIN HUMAN 5 % IV SOLN
12.5000 g | Freq: Once | INTRAVENOUS | Status: AC
  Administered 2016-05-18: 12.5 g via INTRAVENOUS
  Filled 2016-05-18: qty 250

## 2016-05-18 MED ORDER — SODIUM CHLORIDE 0.9% FLUSH
10.0000 mL | Freq: Two times a day (BID) | INTRAVENOUS | Status: DC
  Administered 2016-05-18: 10 mL
  Administered 2016-05-18: 30 mL
  Administered 2016-05-19 – 2016-05-23 (×8): 10 mL
  Administered 2016-05-24: 20 mL
  Administered 2016-05-24: 10 mL
  Administered 2016-05-25: 30 mL
  Administered 2016-05-25: 10 mL
  Administered 2016-05-26: 30 mL

## 2016-05-18 MED ORDER — ALBUMIN HUMAN 5 % IV SOLN
25.0000 g | Freq: Once | INTRAVENOUS | Status: AC
  Administered 2016-05-18: 25 g via INTRAVENOUS
  Filled 2016-05-18: qty 250

## 2016-05-18 NOTE — Consult Note (Signed)
    I saw & examined the patient - full note to follow.   Remains on Pressors, intubated & sedated.   CT chest without suggestion of pericardial effusion.  2D Echo ordered. Pending Echo = no new recommendations   Bryan Lemmaavid Harding, M.D., M.S. Interventional Cardiologist   Pager # [phone] Phone # [phone] [address] [place], [place] [address]
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
## 2016-05-18 NOTE — Progress Notes (Signed)
Peripherally Inserted Central Catheter/Midline Placement  The IV Nurse has discussed with the patient and/or persons authorized to consent for the patient, the purpose of this procedure and the potential benefits and risks involved with this procedure.  The benefits include less needle sticks, lab draws from the catheter, and the patient may be discharged home with the catheter. Risks include, but not limited to, infection, bleeding, blood clot (thrombus formation), and puncture of an artery; nerve damage and irregular heartbeat and possibility to perform a PICC exchange if needed/ordered by physician.  Alternatives to this procedure were also discussed.  Bard Power PICC patient education guide, fact sheet on infection prevention and patient information card has been provided to patient /or left at bedside.    PICC/Midline Placement Documentation        Rickey [name], Rickey [name] Jeanette 05/18/2016, 8:48 AM Consent obtained from wife at bedside by Lazarus Gowdaenee Newman, RN
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
## 2016-05-18 NOTE — Progress Notes (Signed)
Orthopedic Tech Progress Note Patient Details:  Rickey [name] 01/29/1961 [id number]  Patient ID: Rickey [name], male   DOB: 01/29/1961, 55 y.o.   MRN: [id number]   Rickey [name], Rickey [name] 05/18/2016, 9:49 AM Called in bio-tech brace order; [facility]
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
## 2016-05-19 ENCOUNTER — Inpatient Hospital Stay (HOSPITAL_COMMUNITY): Payer: PRIVATE HEALTH INSURANCE

## 2016-05-19 DIAGNOSIS — S2691XA Contusion of heart, unspecified with or without hemopericardium, initial encounter: Secondary | ICD-10-CM

## 2016-05-19 LAB — CBC
HCT: 29.9 % — ABNORMAL LOW (ref 39.0–52.0)
HEMOGLOBIN: 9.9 g/dL — AB (ref 13.0–17.0)
MCH: 30.2 pg (ref 26.0–34.0)
MCHC: 33.1 g/dL (ref 30.0–36.0)
MCV: 91.2 fL (ref 78.0–100.0)
RBC: 3.28 MIL/uL — AB (ref 4.22–5.81)
RDW: 15.6 % — ABNORMAL HIGH (ref 11.5–15.5)
WBC: 11 10*3/uL — AB (ref 4.0–10.5)

## 2016-05-19 LAB — BASIC METABOLIC PANEL
Anion gap: 3 — ABNORMAL LOW (ref 5–15)
BUN: 14 mg/dL (ref 6–20)
CALCIUM: 7.5 mg/dL — AB (ref 8.9–10.3)
CHLORIDE: 112 mmol/L — AB (ref 101–111)
CO2: 25 mmol/L (ref 22–32)
CREATININE: 1.17 mg/dL (ref 0.61–1.24)
GFR calc non Af Amer: >60 mL/min (ref >60)
Glucose, Bld: 118 mg/dL — ABNORMAL HIGH (ref 65–99)
Potassium: 3.9 mmol/L (ref 3.5–5.1)
SODIUM: 140 mmol/L (ref 135–145)

## 2016-05-19 LAB — ECHOCARDIOGRAM LIMITED
HEIGHTINCHES: 69 in
WEIGHTICAEL: 3548.52 [oz_av]

## 2016-05-19 MED ORDER — ACETAMINOPHEN 650 MG RE SUPP
650.0000 mg | RECTAL | Status: DC | PRN
  Administered 2016-05-19 – 2016-05-20 (×2): 650 mg via RECTAL
  Filled 2016-05-19 (×2): qty 1

## 2016-05-19 NOTE — Progress Notes (Signed)
  Echocardiogram 2D Echocardiogram has been performed.  Rickey [name], Rickey [name] 05/19/2016, 12:53 PM
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
## 2016-05-20 ENCOUNTER — Inpatient Hospital Stay (HOSPITAL_COMMUNITY): Payer: PRIVATE HEALTH INSURANCE

## 2016-05-20 LAB — CBC
HEMATOCRIT: 24.8 % — AB (ref 39.0–52.0)
HEMOGLOBIN: 8.6 g/dL — AB (ref 13.0–17.0)
MCH: 31.3 pg (ref 26.0–34.0)
MCHC: 34.7 g/dL (ref 30.0–36.0)
MCV: 90.2 fL (ref 78.0–100.0)
Platelets: 107 10*3/uL — ABNORMAL LOW (ref 150–400)
RBC: 2.75 MIL/uL — ABNORMAL LOW (ref 4.22–5.81)
RDW: 15 % (ref 11.5–15.5)
WBC: 9.9 10*3/uL (ref 4.0–10.5)

## 2016-05-20 LAB — COMPREHENSIVE METABOLIC PANEL
ALK PHOS: 59 U/L (ref 38–126)
ALT: 160 U/L — AB (ref 17–63)
ANION GAP: 5 (ref 5–15)
AST: 305 U/L — ABNORMAL HIGH (ref 15–41)
Albumin: 1.8 g/dL — ABNORMAL LOW (ref 3.5–5.0)
BILIRUBIN TOTAL: 2.3 mg/dL — AB (ref 0.3–1.2)
BUN: 10 mg/dL (ref 6–20)
CO2: 23 mmol/L (ref 22–32)
CREATININE: 0.94 mg/dL (ref 0.61–1.24)
Calcium: 7.1 mg/dL — ABNORMAL LOW (ref 8.9–10.3)
Chloride: 109 mmol/L (ref 101–111)
Glucose, Bld: 165 mg/dL — ABNORMAL HIGH (ref 65–99)
Potassium: 3.6 mmol/L (ref 3.5–5.1)
SODIUM: 137 mmol/L (ref 135–145)
TOTAL PROTEIN: 3.8 g/dL — AB (ref 6.5–8.1)

## 2016-05-20 LAB — GLUCOSE, CAPILLARY
GLUCOSE-CAPILLARY: 80 mg/dL (ref 65–99)
Glucose-Capillary: 81 mg/dL (ref 65–99)
Glucose-Capillary: 85 mg/dL (ref 65–99)
Glucose-Capillary: 90 mg/dL (ref 65–99)

## 2016-05-20 LAB — TRIGLYCERIDES: Triglycerides: 524 mg/dL — ABNORMAL HIGH (ref <150)

## 2016-05-20 MED ORDER — CHLORHEXIDINE GLUCONATE 0.12 % MT SOLN
OROMUCOSAL | Status: AC
  Filled 2016-05-20: qty 15

## 2016-05-20 MED ORDER — ORAL CARE MOUTH RINSE
15.0000 mL | OROMUCOSAL | Status: DC
  Administered 2016-05-20 – 2016-05-22 (×16): 15 mL via OROMUCOSAL

## 2016-05-20 MED ORDER — VITAL HIGH PROTEIN PO LIQD
1000.0000 mL | ORAL | Status: DC
  Administered 2016-05-20: 1000 mL

## 2016-05-20 MED ORDER — CHLORHEXIDINE GLUCONATE 0.12% ORAL RINSE (MEDLINE KIT)
15.0000 mL | Freq: Two times a day (BID) | OROMUCOSAL | Status: DC
  Administered 2016-05-20 – 2016-05-22 (×4): 15 mL via OROMUCOSAL

## 2016-05-20 MED ORDER — CHLORHEXIDINE GLUCONATE 0.12% ORAL RINSE (MEDLINE KIT): 15.0000 mL | Freq: Two times a day (BID) | OROMUCOSAL | Status: DC
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
## 2016-05-20 NOTE — Progress Notes (Signed)
Pt placed back on full vent support due to decreasing sat. - 92%.  Sat recovered to 94%.
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
## 2016-05-21 ENCOUNTER — Inpatient Hospital Stay (HOSPITAL_COMMUNITY): Payer: PRIVATE HEALTH INSURANCE

## 2016-05-21 LAB — COMPREHENSIVE METABOLIC PANEL
ALBUMIN: 1.8 g/dL — AB (ref 3.5–5.0)
ALK PHOS: 57 U/L (ref 38–126)
ALT: 128 U/L — AB (ref 17–63)
AST: 286 U/L — AB (ref 15–41)
Anion gap: 4 — ABNORMAL LOW (ref 5–15)
BILIRUBIN TOTAL: 1.8 mg/dL — AB (ref 0.3–1.2)
BUN: 12 mg/dL (ref 6–20)
CALCIUM: 7.3 mg/dL — AB (ref 8.9–10.3)
CO2: 24 mmol/L (ref 22–32)
CREATININE: 0.83 mg/dL (ref 0.61–1.24)
Chloride: 109 mmol/L (ref 101–111)
GFR calc Af Amer: >60 mL/min (ref >60)
GLUCOSE: 145 mg/dL — AB (ref 65–99)
POTASSIUM: 3.4 mmol/L — AB (ref 3.5–5.1)
Sodium: 137 mmol/L (ref 135–145)
TOTAL PROTEIN: 4 g/dL — AB (ref 6.5–8.1)

## 2016-05-21 LAB — TYPE AND SCREEN
BLOOD PRODUCT EXPIRATION DATE: 201712222359
BLOOD PRODUCT EXPIRATION DATE: 201712222359
BLOOD PRODUCT EXPIRATION DATE: 201712292359
BLOOD PRODUCT EXPIRATION DATE: 201712292359
BLOOD PRODUCT EXPIRATION DATE: 201712302359
BLOOD PRODUCT EXPIRATION DATE: 201712302359
BLOOD PRODUCT EXPIRATION DATE: 201801072359
Blood Product Expiration Date: 201712272359
Blood Product Expiration Date: 201712282359
Blood Product Expiration Date: 201712292359
Blood Product Expiration Date: 201712302359
Blood Product Expiration Date: 201712302359
Blood Product Expiration Date: 201712302359
Blood Product Expiration Date: 201801022359
ISSUE DATE / TIME: 201712141916
ISSUE DATE / TIME: 201712141951
ISSUE DATE / TIME: 201712142005
ISSUE DATE / TIME: 201712151457
ISSUE DATE / TIME: 201712141916
ISSUE DATE / TIME: 201712141951
ISSUE DATE / TIME: 201712151141
ISSUE DATE / TIME: 201712151159
ISSUE DATE / TIME: 201712160036
ISSUE DATE / TIME: 201712160036
UNIT TYPE AND RH: 5100
UNIT TYPE AND RH: 5100
UNIT TYPE AND RH: 5100
UNIT TYPE AND RH: 5100
UNIT TYPE AND RH: 5100
UNIT TYPE AND RH: 9500
UNIT TYPE AND RH: 9500
UNIT TYPE AND RH: 9500
Unit Type and Rh: 5100
Unit Type and Rh: 5100
Unit Type and Rh: 5100
Unit Type and Rh: 9500
Unit Type and Rh: 9500
Unit Type and Rh: 9500

## 2016-05-21 LAB — GLUCOSE, CAPILLARY
GLUCOSE-CAPILLARY: 101 mg/dL — AB (ref 65–99)
GLUCOSE-CAPILLARY: 99 mg/dL (ref 65–99)
Glucose-Capillary: 100 mg/dL — ABNORMAL HIGH (ref 65–99)
Glucose-Capillary: 101 mg/dL — ABNORMAL HIGH (ref 65–99)
Glucose-Capillary: 104 mg/dL — ABNORMAL HIGH (ref 65–99)
Glucose-Capillary: 104 mg/dL — ABNORMAL HIGH (ref 65–99)

## 2016-05-21 LAB — CBC
HEMATOCRIT: 23.3 % — AB (ref 39.0–52.0)
HEMOGLOBIN: 7.9 g/dL — AB (ref 13.0–17.0)
MCH: 30.5 pg (ref 26.0–34.0)
MCHC: 33.9 g/dL (ref 30.0–36.0)
MCV: 90 fL (ref 78.0–100.0)
Platelets: 118 10*3/uL — ABNORMAL LOW (ref 150–400)
RBC: 2.59 MIL/uL — ABNORMAL LOW (ref 4.22–5.81)
RDW: 14.4 % (ref 11.5–15.5)
WBC: 9.5 10*3/uL (ref 4.0–10.5)

## 2016-05-21 LAB — TRIGLYCERIDES: TRIGLYCERIDES: 408 mg/dL — AB (ref <150)

## 2016-05-21 MED ORDER — PIVOT 1.5 CAL PO LIQD
1000.0000 mL | ORAL | Status: DC
  Administered 2016-05-21: 1000 mL
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
## 2016-05-21 NOTE — Progress Notes (Signed)
Sputum sample obtained and sent to lab by RT. 
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
## 2016-05-21 NOTE — Progress Notes (Signed)
Patient ID: Rickey [name], male   DOB: 19-Nov-1960, 55 y.o.   MRN: [id number] Subjective:  The patient is intubated, alert, and nods appropriately.  Objective: Vital signs in last 24 hours: Temp:  [98.2 F (36.8 C)-100.4 F (38 C)] 99.8 F (37.7 C) (12/18 0800) Pulse Rate:  [68-107] 78 (12/18 1130) Resp:  [19-28] 20 (12/18 1130) BP: (97-123)/(54-73) 114/61 (12/18 1130) SpO2:  [91 %-99 %] 96 % (12/18 1130) FiO2 (%):  [40 %] 40 % (12/18 1000) Weight:  [98.2 kg (216 lb 7.9 oz)] 98.2 kg (216 lb 7.9 oz) (12/18 0500)  Intake/Output from previous day: 12/17 0701 - 12/18 0700 In: 3637.1 [I.V.:3245.1; NG/GT:392] Out: 2335 [Urine:2125; Emesis/NG output:100; Chest Tube:110] Intake/Output this shift: Total I/O In: 608.5 [I.V.:508.5; NG/GT:100] Out: 325 [Urine:325]  Physical exam the patient is Glasgow Coma Scale 11 intubated. He follows commands and move his lower extremities well.  Lab Results:  Recent Labs  05/20/16 0410 05/21/16 0557  WBC 9.9 9.5  HGB 8.6* 7.9*  HCT 24.8* 23.3*  PLT 107* 118*   BMET  Recent Labs  05/20/16 0410 05/21/16 0557  NA 137 137  K 3.6 3.4*  CL 109 109  CO2 23 24  GLUCOSE 165* 145*  BUN 10 12  CREATININE 0.94 0.83  CALCIUM 7.1* 7.3*    Studies/Results: Dg Chest Port 1 View  Result Date: 05/21/2016 CLINICAL DATA:  Diaphragm injury. EXAM: PORTABLE CHEST 1 VIEW COMPARISON:  05/20/2016 . FINDINGS: Endotracheal tube, left PICC line, left chest tube in stable position. NG tube noted with tip below left hemidiaphragm. Cardiomegaly with bilateral interstitial prominence and small left pleural effusion consistent congestive heart failure. Left lower lobe atelectasis and consolidation. No pneumothorax . IMPRESSION: 1. Lines and tubes including left chest tube in stable position. 2. Congestive heart failure with pulmonary interstitial edema. Left lower lobe atelectasis and consolidation. Electronically Signed   By: Maisie Fushomas  Register   On: 05/21/2016  07:51   Dg Chest Port 1 View  Result Date: 05/20/2016 CLINICAL DATA:  Decreased breath sounds. EXAM: PORTABLE CHEST 1 VIEW COMPARISON:  Yesterday. FINDINGS: Poor inspiration. Grossly normal sized heart. A left chest tube remains in place with no pneumothorax seen. Endotracheal tube in satisfactory position. Nasogastric tube extending into the stomach. Minimal right basilar atelectasis with improvement and increased airspace opacity at the left lung base. Left PICC tip at the superior cavoatrial junction. Mild left shoulder degenerative changes. IMPRESSION: 1. Increased left basilar atelectasis or pneumonia. 2. Decreased right basilar atelectasis. Electronically Signed   By: Beckie SaltsSteven  Reid M.D.   On: 05/20/2016 07:15    Assessment/Plan: L1 and L4 fractures: The patient will likely heal adequately in a TLSO. He is doing well neurologically. I spoke with his wife.  LOS: 4 days     Rickey [name] D 05/21/2016, 12:00 PM

## 2016-05-22 ENCOUNTER — Inpatient Hospital Stay (HOSPITAL_COMMUNITY): Payer: PRIVATE HEALTH INSURANCE

## 2016-05-22 DIAGNOSIS — S32009A Unspecified fracture of unspecified lumbar vertebra, initial encounter for closed fracture: Secondary | ICD-10-CM | POA: Diagnosis present

## 2016-05-22 DIAGNOSIS — D62 Acute posthemorrhagic anemia: Secondary | ICD-10-CM | POA: Diagnosis not present

## 2016-05-22 DIAGNOSIS — S27321A Contusion of lung, unilateral, initial encounter: Secondary | ICD-10-CM | POA: Diagnosis present

## 2016-05-22 DIAGNOSIS — S92911A Unspecified fracture of right toe(s), initial encounter for closed fracture: Secondary | ICD-10-CM | POA: Diagnosis present

## 2016-05-22 DIAGNOSIS — S92501A Displaced unspecified fracture of right lesser toe(s), initial encounter for closed fracture: Secondary | ICD-10-CM | POA: Diagnosis present

## 2016-05-22 DIAGNOSIS — J9600 Acute respiratory failure, unspecified whether with hypoxia or hypercapnia: Secondary | ICD-10-CM | POA: Diagnosis present

## 2016-05-22 DIAGNOSIS — S322XXA Fracture of coccyx, initial encounter for closed fracture: Secondary | ICD-10-CM | POA: Diagnosis present

## 2016-05-22 DIAGNOSIS — S32049A Unspecified fracture of fourth lumbar vertebra, initial encounter for closed fracture: Secondary | ICD-10-CM | POA: Diagnosis present

## 2016-05-22 LAB — GLUCOSE, CAPILLARY
GLUCOSE-CAPILLARY: 140 mg/dL — AB (ref 65–99)
GLUCOSE-CAPILLARY: 103 mg/dL — AB (ref 65–99)
GLUCOSE-CAPILLARY: 90 mg/dL (ref 65–99)
Glucose-Capillary: 128 mg/dL — ABNORMAL HIGH (ref 65–99)
Glucose-Capillary: 112 mg/dL — ABNORMAL HIGH (ref 65–99)

## 2016-05-22 LAB — BLOOD GAS, ARTERIAL
ACID-BASE DEFICIT: 0.4 mmol/L (ref 0.0–2.0)
Bicarbonate: 24.4 mmol/L (ref 20.0–28.0)
Drawn by: 313941
FIO2: 40
Mode: POSITIVE
O2 SAT: 95.9 %
PCO2 ART: 44.1 mmHg (ref 32.0–48.0)
PEEP/CPAP: 5 cmH2O
PH ART: 7.361 (ref 7.350–7.450)
PO2 ART: 85.2 mmHg (ref 83.0–108.0)
PRESSURE SUPPORT: 5 cmH2O
Patient temperature: 98.6

## 2016-05-22 LAB — CBC WITH DIFFERENTIAL/PLATELET
BASOS PCT: 0 %
Basophils Absolute: 0 10*3/uL (ref 0.0–0.1)
Eosinophils Absolute: 0.1 10*3/uL (ref 0.0–0.7)
Eosinophils Relative: 1 %
HEMATOCRIT: 23.3 % — AB (ref 39.0–52.0)
HEMOGLOBIN: 7.9 g/dL — AB (ref 13.0–17.0)
LYMPHS ABS: 1.1 10*3/uL (ref 0.7–4.0)
Lymphocytes Relative: 13 %
MCH: 30.9 pg (ref 26.0–34.0)
MCHC: 33.9 g/dL (ref 30.0–36.0)
MCV: 91 fL (ref 78.0–100.0)
MONOS PCT: 10 %
Monocytes Absolute: 0.9 10*3/uL (ref 0.1–1.0)
NEUTROS ABS: 6.9 10*3/uL (ref 1.7–7.7)
NEUTROS PCT: 76 %
Platelets: 141 10*3/uL — ABNORMAL LOW (ref 150–400)
RBC: 2.56 MIL/uL — AB (ref 4.22–5.81)
RDW: 14.3 % (ref 11.5–15.5)
WBC: 9 10*3/uL (ref 4.0–10.5)

## 2016-05-22 LAB — BASIC METABOLIC PANEL
Anion gap: 6 (ref 5–15)
BUN: 16 mg/dL (ref 6–20)
CALCIUM: 7.7 mg/dL — AB (ref 8.9–10.3)
CO2: 26 mmol/L (ref 22–32)
Chloride: 111 mmol/L (ref 101–111)
Creatinine, Ser: 0.8 mg/dL (ref 0.61–1.24)
GFR calc Af Amer: >60 mL/min (ref >60)
GLUCOSE: 148 mg/dL — AB (ref 65–99)
Potassium: 3.8 mmol/L (ref 3.5–5.1)
SODIUM: 143 mmol/L (ref 135–145)

## 2016-05-22 MED ORDER — DIPHENHYDRAMINE HCL 50 MG/ML IJ SOLN: 12.5000 mg | Freq: Four times a day (QID) | INTRAMUSCULAR | Status: DC | PRN

## 2016-05-22 MED ORDER — DIPHENHYDRAMINE HCL 12.5 MG/5ML PO ELIX: 12.5000 mg | ORAL_SOLUTION | Freq: Four times a day (QID) | ORAL | Status: DC | PRN

## 2016-05-22 MED ORDER — OXYCODONE HCL 5 MG PO TABS
5.0000 mg | ORAL_TABLET | ORAL | Status: DC | PRN
  Administered 2016-05-22 – 2016-05-25 (×3): 15 mg via ORAL
  Filled 2016-05-22 (×4): qty 3

## 2016-05-22 MED ORDER — ENOXAPARIN SODIUM 30 MG/0.3ML ~~LOC~~ SOLN
30.0000 mg | Freq: Two times a day (BID) | SUBCUTANEOUS | Status: DC
  Administered 2016-05-22 – 2016-05-28 (×13): 30 mg via SUBCUTANEOUS
  Filled 2016-05-22 (×14): qty 0.3

## 2016-05-22 MED ORDER — ONDANSETRON HCL 4 MG/2ML IJ SOLN
4.0000 mg | Freq: Four times a day (QID) | INTRAMUSCULAR | Status: DC | PRN
  Administered 2016-05-22 – 2016-05-25 (×4): 4 mg via INTRAVENOUS
  Filled 2016-05-22 (×5): qty 2

## 2016-05-22 MED ORDER — SODIUM CHLORIDE 0.9% FLUSH: 9.0000 mL | INTRAVENOUS | Status: DC | PRN

## 2016-05-22 MED ORDER — ONDANSETRON HCL 4 MG/2ML IJ SOLN: 4.0000 mg | Freq: Four times a day (QID) | INTRAMUSCULAR | Status: DC | PRN

## 2016-05-22 MED ORDER — NALOXONE HCL 0.4 MG/ML IJ SOLN: 0.4000 mg | INTRAMUSCULAR | Status: DC | PRN

## 2016-05-22 MED ORDER — HYDROMORPHONE 1 MG/ML IV SOLN
INTRAVENOUS | Status: DC
  Administered 2016-05-22: 1.1 mg via INTRAVENOUS
  Administered 2016-05-22: 16:00:00 via INTRAVENOUS
  Administered 2016-05-23: 1.8 mg via INTRAVENOUS
  Administered 2016-05-23 (×2): 1.2 mg via INTRAVENOUS
  Administered 2016-05-23: 0.9 mg via INTRAVENOUS
  Administered 2016-05-23: 2.1 mg via INTRAVENOUS
  Administered 2016-05-23: 1.5 mg via INTRAVENOUS
  Administered 2016-05-24: 1.2 mg via INTRAVENOUS
  Administered 2016-05-24: 0.3 mg via INTRAVENOUS
  Administered 2016-05-24: 0.6 mg via INTRAVENOUS
  Administered 2016-05-24: 0.9 mg via INTRAVENOUS
  Filled 2016-05-22: qty 25

## 2016-05-22 MED ORDER — MORPHINE SULFATE (PF) 2 MG/ML IV SOLN
2.0000 mg | INTRAVENOUS | Status: DC | PRN
  Administered 2016-05-22: 2 mg via INTRAVENOUS
  Filled 2016-05-22: qty 1

## 2016-05-22 MED ORDER — MORPHINE SULFATE 2 MG/ML IV SOLN
INTRAVENOUS | Status: DC
Start: 2016-05-22 — End: 2016-05-22
  Administered 2016-05-22: 13.5 mg via INTRAVENOUS
  Administered 2016-05-22: 12:00:00 via INTRAVENOUS
  Filled 2016-05-22: qty 25
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
## 2016-05-22 NOTE — Progress Notes (Signed)
Wasted 245ml fentanyl gtt with Leo GrosserKim Meyran RN

## 2016-05-22 NOTE — Procedures (Signed)
Extubation Procedure Note  Patient Details:   Name: Rickey [name] DOB: November 16, 1960 MRN: [id number]   Airway Documentation:     Evaluation  O2 sats: stable throughout Complications: No apparent complications Patient did tolerate procedure well. Bilateral Breath Sounds: Rhonchi   Yes  Patient tolerated wean. MD ordered to extubate. Positive for cuff leak. Patient extubated to a 5 Lpm nasal cannula. No signs of stridor or dyspnea. Patient resting comfortably. RN and wife at bedside.   Ancil BoozerSmallwood, Exa Bomba 05/22/2016, 9:55 AM

## 2016-05-22 NOTE — Progress Notes (Signed)
Pt extubated today; still has chest tube to H20 seal.   Plan PT/OT evaluations when able to tolerate therapies.  Will follow progress.    Quintella BatonJulie W. Eliya Bubar, RN, BSN  Trauma/Neuro ICU Case Manager [phone]

## 2016-05-23 ENCOUNTER — Inpatient Hospital Stay (HOSPITAL_COMMUNITY): Payer: PRIVATE HEALTH INSURANCE

## 2016-05-23 LAB — CULTURE, RESPIRATORY W GRAM STAIN: Culture: NORMAL

## 2016-05-23 MED ORDER — FUROSEMIDE 10 MG/ML IJ SOLN
40.0000 mg | Freq: Once | INTRAMUSCULAR | Status: AC
  Administered 2016-05-23: 40 mg via INTRAVENOUS
  Filled 2016-05-23: qty 4
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
## 2016-05-23 NOTE — Progress Notes (Signed)
Trauma Service Note  Subjective: Rickey [name] eating some fruit.  Extubated since yesterday.  IS only up to 800  Objective: Vital signs in last 24 hours: Temp:  [97.5 F (36.4 C)-100 F (37.8 C)] 97.5 F (36.4 C) (12/20 0800) Pulse Rate:  [69-94] 77 (12/20 1000) Resp:  [18-37] 28 (12/20 1000) BP: (94-132)/(45-69) 116/57 (12/20 1000) SpO2:  [89 %-100 %] 95 % (12/20 1000) Weight:  [101.6 kg (223 lb 15.8 oz)] 101.6 kg (223 lb 15.8 oz) (12/20 0411) Last BM Date: 05/20/16  Intake/Output from previous day: 12/19 0701 - 12/20 0700 In: 3057.8 [P.O.:600; I.V.:2332.8; NG/GT:125] Out: 2765 [Urine:2665; Chest Tube:100] Intake/Output this shift: Total I/O In: 400 [I.V.:400] Out: 330 [Urine:330]  General: No distress, but short of breath.  Stood and sat in chair for a long time yesterday.  Lungs: Some wheezing.   No air leak.  IS up to 800  Abd: Mildly distended.  Good bowel sounds.  Extremities: No changes  Neuro: Intact  Lab Results: CBC   Recent Labs  05/21/16 0557 05/22/16 0600  WBC 9.5 9.0  HGB 7.9* 7.9*  HCT 23.3* 23.3*  PLT 118* 141*   BMET  Recent Labs  05/21/16 0557 05/22/16 0600  NA 137 143  K 3.4* 3.8  CL 109 111  CO2 24 26  GLUCOSE 145* 148*  BUN 12 16  CREATININE 0.83 0.80  CALCIUM 7.3* 7.7*   PT/INR No results for input(s): LABPROT, INR in the last 72 hours. ABG  Recent Labs  05/21/16 0930  PHART 7.361  HCO3 24.4    Studies/Results: Dg Chest Port 1 View  Result Date: 05/23/2016 CLINICAL DATA:  Traumatic hemo pneumothorax EXAM: PORTABLE CHEST 1 VIEW COMPARISON:  05/22/2016 FINDINGS: Hypoventilation with decreased lung volumes and bibasilar atelectasis. Lordotic positioning. Endotracheal tube removed.  NG removed. Left chest tube remains in place. Small left effusion. No pneumothorax. IMPRESSION: Hypoventilation with bibasilar atelectasis following extubation Negative for pneumothorax. Small left effusion and left lower lobe consolidation.  Electronically Signed   By: Marlan Palauharles  Clark M.D.   On: 05/23/2016 08:52   Dg Chest Port 1 View  Result Date: 05/22/2016 CLINICAL DATA:  Traumatic diaphragmatic hernia. Left-sided chest tube in place. EXAM: PORTABLE CHEST 1 VIEW COMPARISON:  05/21/2016; 05/19/2016; 05/17/2016; chest CT - 05/17/2016 FINDINGS: Grossly unchanged enlarged cardiac silhouette and mediastinal contours. Stable positioning of support apparatus. No pneumothorax. The lungs remain hypo ventilated with perihilar heterogeneous opacities. Unchanged left basilar/ retrocardiac heterogeneous/ consolidative opacities. Trace of sided effusion is not excluded. Trace amount of left lateral chest wall subcutaneous emphysema, unchanged. Unchanged bones. IMPRESSION: 1.  Stable positioning of support apparatus.  No pneumothorax. 2. Similar findings of hypoventilation and bilateral perihilar and left lower lung/retrocardiac heterogeneous/consolidative opacities. Electronically Signed   By: Simonne ComeJohn  Watts M.D.   On: 05/22/2016 09:01   Dg Foot 2 Views Right  Result Date: 05/21/2016 CLINICAL DATA:  Trauma. EXAM: RIGHT FOOT - 2 VIEW COMPARISON:  No recent prior. FINDINGS: Dislocation of the proximal phalanges of the right third fourth and fifth digits noted. Dislocation is laterally. A fracture is noted at the base the proximal phalanx of the right fifth digit. Soft tissues are unremarkable. No radiopaque foreign bodies. IMPRESSION: Dislocation of the right third, fourth, and fifth proximal phalanges noted. A nondisplaced fracture is noted the base of the proximal phalanx of the right fifth digit noted. Electronically Signed   By: Maisie Fushomas  Register   On: 05/21/2016 13:24   Dg Foot Complete Right  Result Date: 05/22/2016  CLINICAL DATA:  Pain. EXAM: RIGHT FOOT COMPLETE - 3+ VIEW COMPARISON:  05/21/2016. FINDINGS: Fractures of the base of the proximal phalanx of the right fifth digit. Diffuse degenerative change. The finding density is noted adjacent to the  distal aspect of the right second metatarsal. This could represent old or new fracture fragment. This could represent a small exostosis. IMPRESSION: 1. Acute fracture of the base of the proximal phalanx of right fifth digit. Fracture extends into the adjacent joint space. 2. Bony density noted adjacent to the distal aspect of the right second metatarsal. This could represent old or new fracture fragment. This could represent small exostosis. 3. Diffuse degenerative change. Electronically Signed   By: Maisie Fushomas  Register   On: 05/22/2016 09:54    Anti-infectives: Anti-infectives    Start     Dose/Rate Route Frequency Ordered Stop   05/17/16 1827  ceFAZolin (ANCEF) 2,000 mg in dextrose 5 % 50 mL IVPB     over 30 Minutes Intravenous Continuous PRN 05/17/16 1828 05/17/16 1827   05/17/16 1824  ceFAZolin (ANCEF) 2-4 GM/100ML-% IVPB    Comments:  Bryon LionsSimmons, Nicole   : cabinet override      05/17/16 1824 05/18/16 0629      Assessment/Plan: s/p Procedure(s): EXPLORATORY LAPAROTOMY - REPAIR OF DIAPHRAGM, left chest tube placement Continue foley due to diuresing patient and strict I&O Will give Lasix today  Also will try nebulizer Will get CT out tomorrow if output is < 100 cxr tomorrow. Keep in ICU because his lung capacity is very low   LOS: 6 days   Marta LamasJames O. Gae BonWyatt, III, MD, FACS [phone] Trauma Surgeon 05/23/2016
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
## 2016-05-24 LAB — BASIC METABOLIC PANEL
Anion gap: 4 — ABNORMAL LOW (ref 5–15)
Anion gap: 5 (ref 5–15)
BUN: 17 mg/dL (ref 6–20)
BUN: 17 mg/dL (ref 6–20)
CALCIUM: 8.2 mg/dL — AB (ref 8.9–10.3)
CHLORIDE: 114 mmol/L — AB (ref 101–111)
CHLORIDE: 109 mmol/L (ref 101–111)
CO2: 24 mmol/L (ref 22–32)
CO2: 27 mmol/L (ref 22–32)
CREATININE: 0.77 mg/dL (ref 0.61–1.24)
CREATININE: 0.89 mg/dL (ref 0.61–1.24)
Calcium: 7.3 mg/dL — ABNORMAL LOW (ref 8.9–10.3)
GFR calc non Af Amer: >60 mL/min (ref >60)
GFR calc non Af Amer: >60 mL/min (ref >60)
GLUCOSE: 111 mg/dL — AB (ref 65–99)
Glucose, Bld: 96 mg/dL (ref 65–99)
POTASSIUM: 5.8 mmol/L — AB (ref 3.5–5.1)
Potassium: 3.8 mmol/L (ref 3.5–5.1)
Sodium: 142 mmol/L (ref 135–145)
Sodium: 141 mmol/L (ref 135–145)

## 2016-05-24 LAB — CBC WITH DIFFERENTIAL/PLATELET
BASOS PCT: 0 %
Basophils Absolute: 0 10*3/uL (ref 0.0–0.1)
Eosinophils Absolute: 0.1 10*3/uL (ref 0.0–0.7)
Eosinophils Relative: 1 %
HEMATOCRIT: 24 % — AB (ref 39.0–52.0)
Hemoglobin: 7.8 g/dL — ABNORMAL LOW (ref 13.0–17.0)
LYMPHS ABS: 1.7 10*3/uL (ref 0.7–4.0)
Lymphocytes Relative: 18 %
MCH: 30.4 pg (ref 26.0–34.0)
MCHC: 32.5 g/dL (ref 30.0–36.0)
MCV: 93.4 fL (ref 78.0–100.0)
MONOS PCT: 9 %
Monocytes Absolute: 0.8 10*3/uL (ref 0.1–1.0)
NEUTROS PCT: 72 %
Neutro Abs: 7 10*3/uL (ref 1.7–7.7)
Platelets: 234 10*3/uL (ref 150–400)
RBC: 2.57 MIL/uL — AB (ref 4.22–5.81)
RDW: 14.4 % (ref 11.5–15.5)
WBC: 9.7 10*3/uL (ref 4.0–10.5)

## 2016-05-24 LAB — TRIGLYCERIDES: Triglycerides: 215 mg/dL — ABNORMAL HIGH (ref <150)

## 2016-05-24 MED ORDER — IPRATROPIUM-ALBUTEROL 0.5-2.5 (3) MG/3ML IN SOLN: 3.0000 mL | Freq: Four times a day (QID) | RESPIRATORY_TRACT | Status: DC | PRN

## 2016-05-24 MED ORDER — IPRATROPIUM-ALBUTEROL 0.5-2.5 (3) MG/3ML IN SOLN: 3.0000 mL | Freq: Four times a day (QID) | RESPIRATORY_TRACT | Status: DC

## 2016-05-24 MED ORDER — PROMETHAZINE HCL 25 MG/ML IJ SOLN
12.5000 mg | Freq: Once | INTRAMUSCULAR | Status: AC
  Administered 2016-05-24: 12.5 mg via INTRAVENOUS
  Filled 2016-05-24: qty 1

## 2016-05-24 MED ORDER — DEXTROSE-NACL 5-0.9 % IV SOLN
INTRAVENOUS | Status: DC
  Filled 2016-05-24: qty 1000

## 2016-05-24 MED ORDER — SODIUM POLYSTYRENE SULFONATE 15 GM/60ML PO SUSP
30.0000 g | Freq: Once | ORAL | Status: AC
  Administered 2016-05-24: 30 g via ORAL
  Filled 2016-05-24: qty 120

## 2016-05-24 MED ORDER — DEXTROSE-NACL 5-0.9 % IV SOLN
INTRAVENOUS | Status: DC
  Administered 2016-05-24: 10:00:00 via INTRAVENOUS

## 2016-05-24 MED ORDER — SODIUM CHLORIDE 0.9 % IV SOLN
1.0000 g | Freq: Once | INTRAVENOUS | Status: AC
  Administered 2016-05-24: 1 g via INTRAVENOUS
  Filled 2016-05-24: qty 10

## 2016-05-24 MED ORDER — TRAMADOL HCL 50 MG PO TABS
50.0000 mg | ORAL_TABLET | Freq: Four times a day (QID) | ORAL | Status: DC
Start: 2016-05-24 — End: 2016-05-26
  Administered 2016-05-24 – 2016-05-26 (×7): 50 mg via ORAL
  Filled 2016-05-24 (×8): qty 1

## 2016-05-24 MED ORDER — IPRATROPIUM-ALBUTEROL 0.5-2.5 (3) MG/3ML IN SOLN
3.0000 mL | Freq: Three times a day (TID) | RESPIRATORY_TRACT | Status: DC
  Administered 2016-05-24 – 2016-05-26 (×5): 3 mL via RESPIRATORY_TRACT
  Filled 2016-05-24 (×5): qty 3

## 2016-05-24 MED ORDER — HYDROMORPHONE HCL 1 MG/ML IJ SOLN
1.0000 mg | INTRAMUSCULAR | Status: DC | PRN
  Administered 2016-05-24 – 2016-05-26 (×7): 1 mg via INTRAVENOUS
  Filled 2016-05-24 (×7): qty 1

## 2016-05-24 MED ORDER — IPRATROPIUM-ALBUTEROL 0.5-2.5 (3) MG/3ML IN SOLN: 3.0000 mL | Freq: Three times a day (TID) | RESPIRATORY_TRACT | Status: DC

## 2016-05-24 NOTE — Progress Notes (Signed)
Orthopedic Tech Progress Note Patient Details:  Kathrine CordsJames Jeffrey Goldfarb 05-27-1961 [id number]  Ortho Devices Type of Ortho Device: Postop shoe/boot Ortho Device/Splint Interventions: Application   Saul FordyceJennifer C Parrish Bonn 05/24/2016, 1:44 PM
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
## 2016-05-24 NOTE — Progress Notes (Signed)
PT/OT recommending HH follow up at dc; RN Case Manager will follow for home health orders, DME orders nearing dc.    Quintella BatonJulie W. Temekia Caskey, RN, BSN  Trauma/Neuro ICU Case Manager [phone]
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
## 2016-05-25 ENCOUNTER — Inpatient Hospital Stay (HOSPITAL_COMMUNITY): Payer: PRIVATE HEALTH INSURANCE

## 2016-05-25 LAB — BASIC METABOLIC PANEL
Anion gap: 5 (ref 5–15)
BUN: 17 mg/dL (ref 6–20)
CHLORIDE: 108 mmol/L (ref 101–111)
CO2: 28 mmol/L (ref 22–32)
Calcium: 7.9 mg/dL — ABNORMAL LOW (ref 8.9–10.3)
Creatinine, Ser: 0.83 mg/dL (ref 0.61–1.24)
GFR calc Af Amer: >60 mL/min (ref >60)
GFR calc non Af Amer: >60 mL/min (ref >60)
GLUCOSE: 117 mg/dL — AB (ref 65–99)
POTASSIUM: 3.5 mmol/L (ref 3.5–5.1)
Sodium: 141 mmol/L (ref 135–145)

## 2016-05-25 LAB — CBC
HEMATOCRIT: 25.7 % — AB (ref 39.0–52.0)
HEMOGLOBIN: 8.3 g/dL — AB (ref 13.0–17.0)
MCH: 30.9 pg (ref 26.0–34.0)
MCHC: 32.3 g/dL (ref 30.0–36.0)
MCV: 95.5 fL (ref 78.0–100.0)
PLATELETS: 283 10*3/uL (ref 150–400)
RBC: 2.69 MIL/uL — ABNORMAL LOW (ref 4.22–5.81)
RDW: 14.7 % (ref 11.5–15.5)
WBC: 9.6 10*3/uL (ref 4.0–10.5)

## 2016-05-25 MED ORDER — FUROSEMIDE 40 MG PO TABS
40.0000 mg | ORAL_TABLET | Freq: Every day | ORAL | Status: DC
  Administered 2016-05-25 – 2016-05-26 (×2): 40 mg via ORAL
  Filled 2016-05-25 (×2): qty 1

## 2016-05-25 MED ORDER — ONDANSETRON HCL 4 MG PO TABS
8.0000 mg | ORAL_TABLET | Freq: Three times a day (TID) | ORAL | Status: DC | PRN
  Administered 2016-05-26 – 2016-05-29 (×5): 8 mg via ORAL
  Filled 2016-05-25 (×5): qty 2

## 2016-05-25 MED ORDER — ACETAMINOPHEN 325 MG PO TABS
650.0000 mg | ORAL_TABLET | Freq: Four times a day (QID) | ORAL | Status: DC
  Administered 2016-05-25 – 2016-05-26 (×5): 650 mg via ORAL
  Filled 2016-05-25 (×5): qty 2
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
## 2016-05-25 NOTE — Progress Notes (Signed)
Pt tx to 3S from ICU, pt VSS, pt verbalized understanding of tx[facility] report called, all questions answered

## 2016-05-26 ENCOUNTER — Inpatient Hospital Stay (HOSPITAL_COMMUNITY): Payer: PRIVATE HEALTH INSURANCE

## 2016-05-26 LAB — BASIC METABOLIC PANEL
ANION GAP: 6 (ref 5–15)
BUN: 16 mg/dL (ref 6–20)
CALCIUM: 7.8 mg/dL — AB (ref 8.9–10.3)
CO2: 30 mmol/L (ref 22–32)
Chloride: 104 mmol/L (ref 101–111)
Creatinine, Ser: 0.77 mg/dL (ref 0.61–1.24)
GLUCOSE: 113 mg/dL — AB (ref 65–99)
Potassium: 3.5 mmol/L (ref 3.5–5.1)
Sodium: 140 mmol/L (ref 135–145)

## 2016-05-26 MED ORDER — POLYETHYLENE GLYCOL 3350 17 G PO PACK
17.0000 g | PACK | Freq: Every day | ORAL | Status: DC
  Administered 2016-05-27: 17 g via ORAL
  Filled 2016-05-26 (×5): qty 1

## 2016-05-26 MED ORDER — TRAMADOL HCL 50 MG PO TABS
100.0000 mg | ORAL_TABLET | Freq: Four times a day (QID) | ORAL | Status: DC
  Administered 2016-05-26 – 2016-05-29 (×13): 100 mg via ORAL
  Filled 2016-05-26 (×13): qty 2

## 2016-05-26 MED ORDER — HYDROCODONE-ACETAMINOPHEN 7.5-325 MG/15ML PO SOLN: 10.0000 mL | ORAL | Status: DC | PRN

## 2016-05-26 MED ORDER — IPRATROPIUM-ALBUTEROL 0.5-2.5 (3) MG/3ML IN SOLN: 3.0000 mL | Freq: Two times a day (BID) | RESPIRATORY_TRACT | Status: DC

## 2016-05-26 MED ORDER — ACETAMINOPHEN 500 MG PO TABS
1000.0000 mg | ORAL_TABLET | Freq: Four times a day (QID) | ORAL | Status: DC | PRN
  Administered 2016-05-26 – 2016-05-28 (×3): 1000 mg via ORAL
  Filled 2016-05-26 (×3): qty 2

## 2016-05-26 MED ORDER — HYDROMORPHONE HCL 1 MG/ML IJ SOLN
0.5000 mg | INTRAMUSCULAR | Status: DC | PRN
  Administered 2016-05-28 – 2016-05-29 (×3): 0.5 mg via INTRAVENOUS
  Filled 2016-05-26 (×3): qty 1

## 2016-05-26 MED ORDER — DOCUSATE SODIUM 100 MG PO CAPS
100.0000 mg | ORAL_CAPSULE | Freq: Two times a day (BID) | ORAL | Status: DC
  Administered 2016-05-27: 100 mg via ORAL
  Filled 2016-05-26 (×5): qty 1
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
## 2016-05-26 NOTE — Progress Notes (Signed)
Report called pt to transfer to 5C18.

## 2016-05-26 NOTE — Progress Notes (Signed)
PICC removed without difficulty. Insertion site covered with Vaseline gauze and taped. Tip at 45 cm and intact. S/S of infection discussed and teach back demonstrated.

## 2016-05-26 NOTE — Progress Notes (Signed)
Patient ID: Rickey [name], male   DOB: 31-Jan-1961, 55 y.o.   MRN: [id number] Patient doing well back and leg pain controlled to have a little bit of episodic right leg pain when sitting up earlier  Strength 5 out of 5  Check upright x-rays in the brace later

## 2016-05-27 ENCOUNTER — Inpatient Hospital Stay (HOSPITAL_COMMUNITY): Payer: PRIVATE HEALTH INSURANCE

## 2016-05-27 LAB — TRIGLYCERIDES: Triglycerides: 205 mg/dL — ABNORMAL HIGH (ref <150)

## 2016-05-27 NOTE — Plan of Care (Signed)
Problem: Bowel/Gastric: Goal: Will not experience complications related to bowel motility Outcome: Progressing Having liq stools, holding miralax and colace for now.
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
## 2016-05-27 NOTE — Progress Notes (Signed)
Patient ID: Rickey CordsJames Jeffrey [name], male   DOB: [birth year], 55 y.o.   MRN: [id number] Patient complains of low back pain. He is in his brace. He has walked to the bathroom. No leg pain or numbness tingling or weakness. Good strength to in bed exam. X-rays reviewed and show a L4 fracture with about 33% loss of height. Continue to mobilize in brace
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
## 2016-05-28 ENCOUNTER — Inpatient Hospital Stay (HOSPITAL_COMMUNITY): Payer: PRIVATE HEALTH INSURANCE
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
## 2016-05-29 ENCOUNTER — Inpatient Hospital Stay (HOSPITAL_COMMUNITY): Payer: PRIVATE HEALTH INSURANCE

## 2016-05-29 MED ORDER — TRAMADOL HCL 50 MG PO TABS: 100.0000 mg | ORAL_TABLET | Freq: Four times a day (QID) | ORAL | 0 refills | Status: AC | PRN
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
## 2016-05-29 NOTE — Discharge Instructions (Signed)
Wear brace unless flat in bed.  No lifting more than 10 pounds for 6 weeks.

## 2016-05-29 NOTE — Progress Notes (Signed)
Pt is being discharged from hospital per orders from MD. Pt and family were educated on discharge instructions. Pt and family verbalized understanding of instructions. All questions and concerns were addressed before discharge. Pt's IV was removed. Pt exited hospital via wheelchair.

## 2016-05-29 NOTE — Progress Notes (Signed)
Pt unavailable when stopped by to visit. Will try again soon.   05/29/16 1600  Clinical Encounter Type  Visited With Patient not available  Visit Type Initial  Referral From Chaplain  Ephraim Hamburgerynthia A Ashtin Rosner, Chaplain

## 2016-06-11 ENCOUNTER — Telehealth (HOSPITAL_COMMUNITY): Payer: Self-pay

## 2016-06-11 NOTE — Telephone Encounter (Signed)
Verbally approved HHPT plan.

## 2016-06-11 NOTE — Telephone Encounter (Signed)
LM to call back.

## 2016-06-12 ENCOUNTER — Telehealth (HOSPITAL_COMMUNITY): Payer: Self-pay

## 2016-06-12 NOTE — Telephone Encounter (Signed)
Left message authorizing OT plan.

## 2016-06-21 ENCOUNTER — Ambulatory Visit (INDEPENDENT_AMBULATORY_CARE_PROVIDER_SITE_OTHER): Payer: Self-pay | Admitting: Orthopedic Surgery

## 2016-06-23 ENCOUNTER — Encounter (INDEPENDENT_AMBULATORY_CARE_PROVIDER_SITE_OTHER): Payer: Self-pay | Admitting: Orthopedic Surgery

## 2016-06-23 ENCOUNTER — Ambulatory Visit (INDEPENDENT_AMBULATORY_CARE_PROVIDER_SITE_OTHER): Payer: PRIVATE HEALTH INSURANCE

## 2016-06-23 ENCOUNTER — Ambulatory Visit (INDEPENDENT_AMBULATORY_CARE_PROVIDER_SITE_OTHER): Payer: PRIVATE HEALTH INSURANCE | Admitting: Orthopedic Surgery

## 2016-06-23 DIAGNOSIS — I87303 Chronic venous hypertension (idiopathic) without complications of bilateral lower extremity: Secondary | ICD-10-CM | POA: Diagnosis not present

## 2016-06-23 DIAGNOSIS — S92501D Displaced unspecified fracture of right lesser toe(s), subsequent encounter for fracture with routine healing: Secondary | ICD-10-CM

## 2016-06-23 DIAGNOSIS — S92911D Unspecified fracture of right toe(s), subsequent encounter for fracture with routine healing: Secondary | ICD-10-CM | POA: Diagnosis not present
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
## 2016-07-23 ENCOUNTER — Encounter (INDEPENDENT_AMBULATORY_CARE_PROVIDER_SITE_OTHER): Payer: Self-pay | Admitting: Orthopedic Surgery

## 2016-07-23 ENCOUNTER — Ambulatory Visit (INDEPENDENT_AMBULATORY_CARE_PROVIDER_SITE_OTHER): Payer: PRIVATE HEALTH INSURANCE

## 2016-07-23 ENCOUNTER — Ambulatory Visit (INDEPENDENT_AMBULATORY_CARE_PROVIDER_SITE_OTHER): Payer: PRIVATE HEALTH INSURANCE | Admitting: Orthopedic Surgery

## 2016-07-23 VITALS — Ht 69.0 in | Wt 208.0 lb

## 2016-07-23 DIAGNOSIS — S92501D Displaced unspecified fracture of right lesser toe(s), subsequent encounter for fracture with routine healing: Secondary | ICD-10-CM

## 2016-07-23 DIAGNOSIS — S92911D Unspecified fracture of right toe(s), subsequent encounter for fracture with routine healing: Secondary | ICD-10-CM | POA: Diagnosis not present
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
## 2016-08-20 ENCOUNTER — Ambulatory Visit (INDEPENDENT_AMBULATORY_CARE_PROVIDER_SITE_OTHER): Payer: PRIVATE HEALTH INSURANCE | Admitting: Family

## 2016-08-20 ENCOUNTER — Encounter (INDEPENDENT_AMBULATORY_CARE_PROVIDER_SITE_OTHER): Payer: Self-pay | Admitting: Orthopedic Surgery

## 2016-08-20 VITALS — Ht 69.0 in | Wt 208.0 lb

## 2016-08-20 DIAGNOSIS — S92911D Unspecified fracture of right toe(s), subsequent encounter for fracture with routine healing: Secondary | ICD-10-CM | POA: Diagnosis not present

## 2016-08-20 DIAGNOSIS — S92501D Displaced unspecified fracture of right lesser toe(s), subsequent encounter for fracture with routine healing: Secondary | ICD-10-CM | POA: Diagnosis not present
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
